# Patient Record
Sex: Female | Born: 1965 | Race: White | Hispanic: No | Marital: Married
Health system: Southern US, Community
[De-identification: ages and names within clinical notes are randomized; demographics above are authoritative.]

---

## 2012-10-10 ENCOUNTER — Inpatient Hospital Stay: Payer: Self-pay | Admitting: Specialist

## 2012-10-10 LAB — URINALYSIS, COMPLETE
Bilirubin,UR: NEGATIVE
Glucose,UR: NEGATIVE mg/dL (ref 0–75)
Ketone: NEGATIVE
Nitrite: NEGATIVE
Ph: 6 (ref 4.5–8.0)
Protein: >=500
RBC,UR: 3 /HPF (ref 0–5)
Specific Gravity: 1.033 (ref 1.003–1.030)
Squamous Epithelial: 4

## 2012-10-10 LAB — COMPREHENSIVE METABOLIC PANEL
Anion Gap: 8 (ref 7–16)
BUN: 8 mg/dL (ref 7–18)
Bilirubin,Total: 1.1 mg/dL — ABNORMAL HIGH (ref 0.2–1.0)
Calcium, Total: 8.7 mg/dL (ref 8.5–10.1)
Chloride: 102 mmol/L (ref 98–107)
EGFR (African American): >60
EGFR (Non-African Amer.): >60
Glucose: 194 mg/dL — ABNORMAL HIGH (ref 65–99)
Potassium: 2.7 mmol/L — ABNORMAL LOW (ref 3.5–5.1)
Total Protein: 8.3 g/dL — ABNORMAL HIGH (ref 6.4–8.2)

## 2012-10-10 LAB — DRUG SCREEN, URINE
Barbiturates, Ur Screen: NEGATIVE (ref ?–200)
Benzodiazepine, Ur Scrn: NEGATIVE (ref ?–200)
Cocaine Metabolite,Ur ~~LOC~~: NEGATIVE (ref ?–300)
Opiate, Ur Screen: NEGATIVE (ref ?–300)
Tricyclic, Ur Screen: NEGATIVE (ref ?–1000)

## 2012-10-10 LAB — CBC
HCT: 48.2 % — ABNORMAL HIGH (ref 35.0–47.0)
HGB: 16.4 g/dL — ABNORMAL HIGH (ref 12.0–16.0)
MCH: 28.6 pg (ref 26.0–34.0)
RBC: 5.73 10*6/uL — ABNORMAL HIGH (ref 3.80–5.20)

## 2012-10-10 LAB — ETHANOL: Ethanol: <3 mg/dL

## 2012-10-11 LAB — COMPREHENSIVE METABOLIC PANEL
Albumin: 3.6 g/dL (ref 3.4–5.0)
Albumin: 3.5 g/dL (ref 3.4–5.0)
Alkaline Phosphatase: 124 U/L (ref 50–136)
Anion Gap: 12 (ref 7–16)
BUN: 8 mg/dL (ref 7–18)
Bilirubin,Total: 1.4 mg/dL — ABNORMAL HIGH (ref 0.2–1.0)
Bilirubin,Total: 1.2 mg/dL — ABNORMAL HIGH (ref 0.2–1.0)
Calcium, Total: 8.3 mg/dL — ABNORMAL LOW (ref 8.5–10.1)
Calcium, Total: 8.8 mg/dL (ref 8.5–10.1)
Chloride: 101 mmol/L (ref 98–107)
Co2: 26 mmol/L (ref 21–32)
Co2: 25 mmol/L (ref 21–32)
Creatinine: 0.76 mg/dL (ref 0.60–1.30)
EGFR (African American): >60
EGFR (Non-African Amer.): >60
EGFR (Non-African Amer.): >60
Glucose: 294 mg/dL — ABNORMAL HIGH (ref 65–99)
Osmolality: 286 (ref 275–301)
Osmolality: 283 (ref 275–301)
SGOT(AST): 50 U/L — ABNORMAL HIGH (ref 15–37)
SGOT(AST): 40 U/L — ABNORMAL HIGH (ref 15–37)
SGPT (ALT): 82 U/L — ABNORMAL HIGH (ref 12–78)
SGPT (ALT): 79 U/L — ABNORMAL HIGH (ref 12–78)
Sodium: 139 mmol/L (ref 136–145)
Sodium: 137 mmol/L (ref 136–145)
Total Protein: 7.8 g/dL (ref 6.4–8.2)

## 2012-10-11 LAB — SALICYLATE LEVEL: Salicylates, Serum: <1.7 mg/dL

## 2012-10-11 LAB — CBC WITH DIFFERENTIAL/PLATELET
Basophil %: 0.2 %
Eosinophil %: 0 %
MCH: 29.3 pg (ref 26.0–34.0)
MCHC: 35.3 g/dL (ref 32.0–36.0)
Monocyte #: 0.1 x10 3/mm — ABNORMAL LOW (ref 0.2–0.9)
Monocyte %: 1 %
Neutrophil #: 10.8 10*3/uL — ABNORMAL HIGH (ref 1.4–6.5)
Neutrophil %: 88.5 %
Platelet: 230 10*3/uL (ref 150–440)
RBC: 5.38 10*6/uL — ABNORMAL HIGH (ref 3.80–5.20)
WBC: 12.2 10*3/uL — ABNORMAL HIGH (ref 3.6–11.0)

## 2012-10-11 LAB — MAGNESIUM: Magnesium: 1.6 mg/dL — ABNORMAL LOW

## 2012-10-11 LAB — ACETAMINOPHEN LEVEL: Acetaminophen: <2 ug/mL

## 2012-10-11 LAB — PROTIME-INR
INR: 1.2
Prothrombin Time: 15 secs — ABNORMAL HIGH (ref 11.5–14.7)

## 2012-10-12 ENCOUNTER — Inpatient Hospital Stay: Payer: Self-pay | Admitting: Psychiatry

## 2012-10-12 LAB — COMPREHENSIVE METABOLIC PANEL
Albumin: 3.2 g/dL — ABNORMAL LOW (ref 3.4–5.0)
Alkaline Phosphatase: 108 U/L (ref 50–136)
Anion Gap: 6 — ABNORMAL LOW (ref 7–16)
Calcium, Total: 8.4 mg/dL — ABNORMAL LOW (ref 8.5–10.1)
Chloride: 109 mmol/L — ABNORMAL HIGH (ref 98–107)
Co2: 26 mmol/L (ref 21–32)
Creatinine: 0.79 mg/dL (ref 0.60–1.30)
EGFR (African American): >60
Glucose: 175 mg/dL — ABNORMAL HIGH (ref 65–99)
Osmolality: 284 (ref 275–301)
Potassium: 3.4 mmol/L — ABNORMAL LOW (ref 3.5–5.1)
SGOT(AST): 21 U/L (ref 15–37)

## 2012-10-13 LAB — URINALYSIS, COMPLETE
Bilirubin,UR: NEGATIVE
Leukocyte Esterase: NEGATIVE
Nitrite: NEGATIVE
Protein: NEGATIVE
RBC,UR: 2 /HPF (ref 0–5)
Squamous Epithelial: 3
WBC UR: 1 /HPF (ref 0–5)

## 2012-10-13 LAB — BEHAVIORAL MEDICINE 1 PANEL
Albumin: 3.1 g/dL — ABNORMAL LOW (ref 3.4–5.0)
Alkaline Phosphatase: 111 U/L (ref 50–136)
Basophil %: 0.9 %
Chloride: 106 mmol/L (ref 98–107)
Co2: 30 mmol/L (ref 21–32)
EGFR (Non-African Amer.): >60
Eosinophil #: 0.1 10*3/uL (ref 0.0–0.7)
Eosinophil %: 1.8 %
HCT: 42.2 % (ref 35.0–47.0)
HGB: 14.5 g/dL (ref 12.0–16.0)
Lymphocyte %: 35 %
MCHC: 34.4 g/dL (ref 32.0–36.0)
MCV: 85 fL (ref 80–100)
Neutrophil #: 4.2 10*3/uL (ref 1.4–6.5)
Osmolality: 280 (ref 275–301)
Platelet: 192 10*3/uL (ref 150–440)
Potassium: 3 mmol/L — ABNORMAL LOW (ref 3.5–5.1)
RBC: 4.94 10*6/uL (ref 3.80–5.20)
RDW: 14.3 % (ref 11.5–14.5)
SGOT(AST): 49 U/L — ABNORMAL HIGH (ref 15–37)
SGPT (ALT): 62 U/L (ref 12–78)
Sodium: 140 mmol/L (ref 136–145)
Thyroid Stimulating Horm: 3.93 u[IU]/mL
Total Protein: 6.3 g/dL — ABNORMAL LOW (ref 6.4–8.2)
WBC: 7.5 10*3/uL (ref 3.6–11.0)

## 2012-10-16 LAB — BASIC METABOLIC PANEL
BUN: 16 mg/dL (ref 7–18)
Chloride: 102 mmol/L (ref 98–107)
Creatinine: 0.88 mg/dL (ref 0.60–1.30)
EGFR (Non-African Amer.): >60
Osmolality: 279 (ref 275–301)

## 2012-10-21 ENCOUNTER — Emergency Department: Payer: Self-pay | Admitting: Emergency Medicine

## 2013-03-23 ENCOUNTER — Emergency Department: Payer: Self-pay | Admitting: Emergency Medicine

## 2013-03-23 LAB — CBC WITH DIFFERENTIAL/PLATELET
Eosinophil %: 1.1 %
Lymphocyte %: 29.7 %
MCHC: 34.2 g/dL (ref 32.0–36.0)
MCV: 86 fL (ref 80–100)
Monocyte #: 0.4 x10 3/mm (ref 0.2–0.9)
Monocyte %: 6 %
Neutrophil #: 4.6 10*3/uL (ref 1.4–6.5)
Neutrophil %: 62.4 %
Platelet: 249 10*3/uL (ref 150–440)
RBC: 5.64 10*6/uL — ABNORMAL HIGH (ref 3.80–5.20)
RDW: 13.6 % (ref 11.5–14.5)

## 2013-03-23 LAB — COMPREHENSIVE METABOLIC PANEL
BUN: 9 mg/dL (ref 7–18)
Bilirubin,Total: 1 mg/dL (ref 0.2–1.0)
Chloride: 103 mmol/L (ref 98–107)
Creatinine: 0.72 mg/dL (ref 0.60–1.30)
EGFR (African American): >60
Glucose: 105 mg/dL — ABNORMAL HIGH (ref 65–99)
Osmolality: 275 (ref 275–301)
SGOT(AST): 61 U/L — ABNORMAL HIGH (ref 15–37)
SGPT (ALT): 79 U/L — ABNORMAL HIGH (ref 12–78)
Sodium: 138 mmol/L (ref 136–145)
Total Protein: 7.9 g/dL (ref 6.4–8.2)

## 2013-03-23 LAB — URINALYSIS, COMPLETE
Glucose,UR: NEGATIVE mg/dL (ref 0–75)
Ketone: NEGATIVE
Nitrite: NEGATIVE
RBC,UR: 1 /HPF (ref 0–5)

## 2013-03-23 LAB — PREGNANCY, URINE: Pregnancy Test, Urine: NEGATIVE m[IU]/mL

## 2013-04-24 ENCOUNTER — Emergency Department: Payer: Self-pay | Admitting: Emergency Medicine

## 2013-04-24 LAB — URINALYSIS, COMPLETE
Bacteria: NONE SEEN
Bilirubin,UR: NEGATIVE
Glucose,UR: NEGATIVE mg/dL (ref 0–75)
Nitrite: NEGATIVE
Ph: 7 (ref 4.5–8.0)
Protein: NEGATIVE
RBC,UR: 5 /HPF (ref 0–5)
Specific Gravity: 1.011 (ref 1.003–1.030)
WBC UR: 1 /HPF (ref 0–5)

## 2013-04-24 LAB — CBC
HCT: 44 % (ref 35.0–47.0)
MCH: 28.6 pg (ref 26.0–34.0)
MCHC: 33.4 g/dL (ref 32.0–36.0)
Platelet: 172 10*3/uL (ref 150–440)
WBC: 5.9 10*3/uL (ref 3.6–11.0)

## 2013-04-24 LAB — COMPREHENSIVE METABOLIC PANEL
Albumin: 3.4 g/dL (ref 3.4–5.0)
Alkaline Phosphatase: 125 U/L — ABNORMAL HIGH
Bilirubin,Total: 0.8 mg/dL (ref 0.2–1.0)
EGFR (Non-African Amer.): >60
Osmolality: 281 (ref 275–301)
Potassium: 3.5 mmol/L (ref 3.5–5.1)
SGPT (ALT): 42 U/L (ref 12–78)

## 2013-10-28 ENCOUNTER — Emergency Department: Payer: Self-pay | Admitting: Emergency Medicine

## 2013-10-28 LAB — COMPREHENSIVE METABOLIC PANEL
ALK PHOS: 102 U/L
AST: 22 U/L (ref 15–37)
Albumin: 3.8 g/dL (ref 3.4–5.0)
Anion Gap: 6 — ABNORMAL LOW (ref 7–16)
BUN: 11 mg/dL (ref 7–18)
Bilirubin,Total: 0.8 mg/dL (ref 0.2–1.0)
CALCIUM: 8.7 mg/dL (ref 8.5–10.1)
CHLORIDE: 103 mmol/L (ref 98–107)
Co2: 29 mmol/L (ref 21–32)
Creatinine: 0.9 mg/dL (ref 0.60–1.30)
EGFR (African American): >60
EGFR (Non-African Amer.): >60
Glucose: 97 mg/dL (ref 65–99)
OSMOLALITY: 275 (ref 275–301)
POTASSIUM: 3.2 mmol/L — AB (ref 3.5–5.1)
SGPT (ALT): 31 U/L (ref 12–78)
Sodium: 138 mmol/L (ref 136–145)
Total Protein: 7.7 g/dL (ref 6.4–8.2)

## 2013-10-28 LAB — HCG, QUANTITATIVE, PREGNANCY

## 2013-10-28 LAB — URINALYSIS, COMPLETE
BILIRUBIN, UR: NEGATIVE
Bacteria: NONE SEEN
GLUCOSE, UR: NEGATIVE mg/dL (ref 0–75)
Ketone: NEGATIVE
LEUKOCYTE ESTERASE: NEGATIVE
Nitrite: NEGATIVE
Ph: 7 (ref 4.5–8.0)
Protein: NEGATIVE
SPECIFIC GRAVITY: 1.002 (ref 1.003–1.030)
Squamous Epithelial: 1
WBC UR: NONE SEEN /HPF (ref 0–5)

## 2013-10-28 LAB — CBC
HCT: 42.7 % (ref 35.0–47.0)
HGB: 14.1 g/dL (ref 12.0–16.0)
MCH: 26.9 pg (ref 26.0–34.0)
MCHC: 32.9 g/dL (ref 32.0–36.0)
MCV: 82 fL (ref 80–100)
Platelet: 217 10*3/uL (ref 150–440)
RBC: 5.23 10*6/uL — ABNORMAL HIGH (ref 3.80–5.20)
RDW: 15.3 % — ABNORMAL HIGH (ref 11.5–14.5)
WBC: 8.9 10*3/uL (ref 3.6–11.0)
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
## 2014-08-24 NOTE — H&P (Signed)
PATIENT NAME:  Brenda Mcbride, Brenda Mcbride MR#:  [id number] DATE OF BIRTH:  October 27, 1965  DATE OF ADMISSION:  10/11/2012  REFERRING PHYSICIAN: Dr. Manson Passey.   PRIMARY CARE PHYSICIAN: Nonlocal.   CHIEF COMPLAINT: Intentional Tylenol drug overdose.   HISTORY OF PRESENT ILLNESS: The patient is a 49 year old Caucasian female with past medical history of hypertension not on any medications, chronic depression, obesity. She is brought into the ER after she intentionally overdosed herself with 40 of 500 mg Tylenol at around 9 a.m. today. The patient is reporting that she had an argument with her husband. Following that, she wanted to kill herself and took Tylenol. Denies any other medication overdosing. She was brought into the ER, and she was started on IV Mucomyst for overdose. No family members at bedside. The patient was made IVC, and hospitalist team is called to admit the patient. The patient has developed a rash on her upper extremities and upper torso while receiving Mucomyst infusion. The Mucomyst infusion was discontinued by the ER physician, and she was given Benadryl and Solu-Medrol. Eventually during my examination, the patient was complaining of tongue feeling funny. Denies any shortness of breath, chest tightness or throat closing sensation. The patient has received EpiPen immediately. Call is placed to Poison Control. The patient also reported that her rash on the chest and upper extremities is fading away after given Benadryl and Solu-Medrol. Denies any other complaints. Denies nausea, vomiting, dizziness or loss of consciousness.   PAST MEDICAL HISTORY: Hypertension not on any medications, chronic depression not seeing any psychiatrist or behavioral therapist, obesity.   PAST SURGICAL HISTORY: C-section, tonsillectomy, fistulectomy.   ALLERGIES: The patient has developed a rash to MUCOMYST.   PSYCHOSOCIAL HISTORY: Lives at home with her husband and son. Denies any history of smoking, alcohol or illicit drug  usage.   FAMILY HISTORY: Both parents have history of hypertension.   HOME MEDICATIONS: None.    REVIEW OF SYSTEMS:  CONSTITUTIONAL: Denies any fever, fatigue, weakness, weight loss or weight gain.  EYES: Denies any blurry vision, glaucoma, cataracts.  ENT: Denies any tinnitus, epistaxis, discharge, postnasal drip or sinus pain.  RESPIRATION: Denies any cough, hemoptysis, dyspnea, painful respiration.  CARDIOVASCULAR: No chest pain, palpitations or syncope.  GASTROINTESTINAL: Denies any nausea, vomiting, diarrhea, abdominal pain.  GENITOURINARY: No dysuria or hematuria.  GYNECOLOGIC AND BREASTS: Denies any breast mass or vaginal discharge.  ENDOCRINE: Denies any polyuria, nocturia, thyroid problems.  HEMATOLOGIC AND LYMPHATIC: Denies any anemia, easy bruising or bleeding.  INTEGUMENTARY: No acne. Developed rash after given Mucomyst infusion which is fading away after given Solu-Medrol and Benadryl.  MUSCULOSKELETAL: Denies any pain in the neck, back, shoulder. Denies any gout.  NEUROLOGIC: Denies any vertigo, ataxia, dementia, stroke.  PSYCHIATRIC: Chronic history of depression is present. Denies any insomnia, bipolar disorder or schizophrenia.   PHYSICAL EXAMINATION:  VITAL SIGNS: Temperature 98.3, pulse 77, respirations 18, blood pressure 116/75, pulse ox 97%.  GENERAL APPEARANCE: Not in any acute distress, moderately built and obese.  HEENT: Normocephalic, atraumatic. Pupils are equally reacting light and accommodation. No scleral icterus. No conjunctival injection. No sinus tenderness. No postnasal drip. Oral cavity: Uvula is midline. Status post tonsillectomy. No edema of the lips or tongue.  NECK: Supple. No JVD. No thyromegaly. No lymphadenopathy.  LUNGS: Clear to auscultation. No accessory muscle usage. No anterior chest wall tenderness on palpation.  CARDIAC: S1, S2 normal. Regular rate and rhythm. No murmurs.  GASTROINTESTINAL: Soft. Bowel sounds are positive in all 4  quadrants. Obese. No masses  felt. No hepatosplenomegaly.  NEUROLOGIC: Awake, alert, oriented x3. Motor and sensory are grossly intact. Cranial nerves II through XII are intact. Reflexes are 2+.  EXTREMITIES: No edema. No cyanosis. No clubbing.  PSYCHIATRIC: Flat mood and affect.  SKIN: No urticaria. Fading rash is noticed on the upper extremities and upper torso. Rash has faded away. Normal turgor. No other lesions are noticed.  MUSCULOSKELETAL: No joint effusion, tenderness or erythema.   LABS AND IMAGING STUDIES: Urinalysis: Yellow in color, clear in appearance, nitrites negative, leukocyte esterase negative. Serum Tylenol level 24. Salicylate level less than 1.7 at 20/30 minutes. Urine pregnancy test is negative.hemoglobin 16.4, hematocrit 48.2, platelets 247. TSH is 2.52. Urine drug screen is negative. LFTs: Total protein is elevated at 8.3, albumin 4.1, bili total is 1.1, alkaline phosphatase 148, AST 62, ALT 87. Glucose 194, BUN 8, creatinine 0.79, sodium 136, potassium is 2.7, chloride 102, CO2 26. GFR greater than 60. Anion gap 8. Serum osmolality 276. Calcium 8.7. Serum alcohol level is less than 3. Alcohol percent less than 0.003.   ASSESSMENT AND PLAN: A 49 year old Caucasian female presenting to the ER after intentional Tylenol drug overdose and developed allergic reaction rash to ACETYLCYSTEINE after starting infusion. Will be admitted with the following assessment and plan:  1. Intentional Tylenol overdose: The patient was given Mucomyst intravenous with close monitoring for allergic reaction. Will admit her to critical care unit for close observation. Oxygen as needed for shortness of breath and hypoxia. EpiPen as needed will be given for anaphylactic reaction. Will repeat complete metabolic panel and Tylenol levels every 4 hours. Will check a CBC, PT, INR in a.m.  2. Rash with MUCOMYST but no anaphylactic reaction. The patient has received EpiPen , Solu-Medrol intravenous, Benadryl and  ranitidine in the ER. Poison Control was contacted and awaiting call back. Will restart Mucomyst after complete fading of the rash  approximately in 1 hour.  3. Hypertension: Blood pressure is elevated. Will give her a small dose of amlodipine.  4. Acute on chronic depression: Psych consult is placed. The patient is made IVC. Will continue one-on-one observation.  5. Will provide her gastrointestinal prophylaxis with ranitidine and deep vein thrombosis prophylaxis with Lovenox subcutaneous.   She is FULL CODE.    The diagnosis and plan of care was discussed in detail with the patient. She is aware of the plan.   CRITICAL CARE TIME SPENT: 60 minutes.   ____________________________ Ramonita LabAruna Johnryan Sao, MD ag:gb D: 10/11/2012 00:44:08 ET T: 10/11/2012 01:17:48 ET JOB#: [id number]  cc: Ramonita LabAruna Lavonna Lampron, MD, <Dictator> Ramonita LabARUNA Stefon Ramthun MD ELECTRONICALLY SIGNED 10/12/2012 5:12

## 2014-08-24 NOTE — Discharge Summary (Signed)
PATIENT NAME:  Brenda FendtBENNETT, Malvika MR#:  [id number] DATE OF BIRTH:  04-17-1966  DATE OF ADMISSION:  10/10/2012 DATE OF DISCHARGE:  10/12/2012  The patient was discharged to Behavioral Medicine on 10/12/2012.   For a detailed note, please take a look at the history and physical done on admission by Dr. Amado CoeGouru.   DIAGNOSES AT DISCHARGE: 1.  Tylenol toxicity.  2.  Depression with suicide attempt.  3.  Hypertension.  4.  Hypokalemia.   DIET:  The patient was discharged on a low-sodium diet.   ACTIVITY:  As tolerated.   FOLLOW-UP:  With her primary care physician.  She needs one assigned.   PERTINENT STUDIES DONE DURING THE HOSPITAL COURSE:  Are as follows:  A Tylenol level done on June 9th at 8:30 in the evening at 24.  Tylenol level done on June 10th at 6:00 a.m. was less than 2.   BRIEF HOSPITAL COURSE:  This is a 49 year old female who presented to the hospital on 10/11/2012 secondary to a suicide attempt with increasing intake of Tylenol.  1.  Drug overdose.  This was secondary to Tylenol.  The patient apparently took greater than 50 pills of 500 mg tabs of Tylenol.  When she presented to the hospital her LFTs were mildly elevated.  She had no coagulopathy.  She had no abdominal pain, nausea, vomiting.  The patient was started on a Mucomyst drip.  Apparently she developed some kind of an ALLERGIC REACTION TO MUCOMYST which was intermittently stopped for a little bit and then restarted after she was pretreated with some steroids and some Benadryl.  The patient received Mucomyst for about 24 hours and her LFTs normalized.  She had no nausea, vomiting and no other systemic complaints.  The patient was involuntarily committed given her suicide attempt and depression and a psychiatric consult was also obtained.  2.  Hypertension.  The patient has a history of hypertension and was not taking any meds.  She was noted to have significantly elevated blood pressures.  In the previous past she had been on  hydrochlorothiazide and atenolol.  I started her on some Coreg and Norvasc while in the hospital which seemed to control her blood pressure.  Also placed her on some IV labetalol, as needed hydralazine, which seemed to improve her blood pressure significantly.  The patient at this time is being discharged to Behavioral Medicine on the Coreg and Norvasc as stated.  3.  Hypokalemia.  This was secondary to poor by mouth intake.  This has since then been supplemented and now resolved.  4.  Depression with suicide attempt.  The patient was placed on involuntarily commitment in the Emergency Room.  A psychiatric consult was obtained.  Given her history of depression and apparent suicide attempt, she was transferred to Behavioral Medicine on 10/12/2012.   DISCHARGE MEDICATIONS:  Norvasc 10 mg daily and Coreg 6.25 mg twice daily.   Time spent on discharge is 40 minutes.    ____________________________ Rolly PancakeVivek J. Cherlynn KaiserSainani, MD vjs:ea D: 10/14/2012 15:22:49 ET T: 10/15/2012 01:54:43 ET JOB#: [id number]  cc: Rolly PancakeVivek J. Cherlynn KaiserSainani, MD, <Dictator> Houston SirenVIVEK J Siona Coulston MD ELECTRONICALLY SIGNED 10/21/2012 20:38

## 2014-09-11 NOTE — H&P (Signed)
Psychiatric Admission Assessment Adult Patient Identification: 49 yo caucasian female Date of Evaluation:  10/13/2012 Chief Complaint: suicide attempt due to several stressors History of Present Illness (8 essential elements):  Patient is a [age] yo white married female who overdosed on 40 pills of 500mg  of tylenol. states she is having relationship problems with her husband, some financial issues. States she became overwhelmed with her life. She has never been in therapy or have psychiaTRIC HOSPITALIZATIONS. Reports her husband had addiction problems , was in rehab and is better now. however he has taken up a low paying job and has been pressurizing her to work. This has made her more anxious and depressed.has 4 children, they are doing well.use of drugs or alcohol. Associated Signs/SymptomsSymptoms:  endorsing depressed mood, anhedonia, hopelessness, poor sleep (Hypo) Manic Symptoms:  deniesAnxiety Symptoms:  high anxiety Psychotic Symptoms:  denies PTSD Symptoms:  Denies  Psychiatric Specialty Exam: Physical Exam:  Completed in ED, reviewed, stable  ROS:   CONSTITUTIONAL: No weight loss, fever, chills, weakness or fatigue. HEENT: Eyes: No diplopia or blurred vision. ENT: No earache, sore throat or runny nose.  CARDIOVASCULAR: No pressure, squeezing, strangling, tightness, heaviness or aching about the chest, neck, axilla or epigastrium.  RESPIRATORY: No cough, shortness of breath, dyspnea or orthopnea.  GASTROINTESTINAL: No nausea, vomiting or diarrhea.  GENITOURINARY: No dysuria, frequency or urgency.  MUSCULOSKELETAL: No muscle or joint pain, stiffness.  SKIN: No change in skin, hair or nails.  NEURO: No paresthesias, fasciculations, seizures or weakness.  PSYCHIATRIC: depression, anxiety  ENDOCRINE: No heat or cold intolerance, polyuria or polydipsia.  HEMATOLOGICAL: No easy bruising or bleeding.   Vital Signs:  Blood pressure 160/96, pulse 115, temperature 98.3 degrees F oral, respiratory  rate 18  General Appearance:  Neat  Eye Contact:  Fair  Speech:  Normal rate  Volume:  Normal  Mood:  Agitated  Affect:  Flat  Thought Process:  normal  Orientation:  Alert and oriented x 3  Thought Content:  ruminative  Suicidal Thoughts: Denies  Homicidal Thoughts:  Denies  Memory:  fair  Judgment: fair  Insight:  fair  Psychomotor Activity:  normal  Concentration:  fair  Recall:  fair  Akathisia:  None  Handed:  Right  AIMS (if indicated):    Assets:  Resilience, physical health, social support  Sleep:  0 hours   Past Psychiatric History: Diagnosis:  MDD  Hospitalizations:  none  Outpatient Care:  None  Substance Abuse Care:  None  Self-Mutilation:  None  Suicidal Attempts:  Denies  Violent Behaviors:  Denies   Past Medical History:  None (denies)  Past Medical (concussions, head injury, cardiac issues with apnea episode):  None  Allergies:  None  PTA Medications:  None  Previous Psychotropic Medications:  None  Medication/Dose:  None           Substance Abuse History in the last 12 months:  denies Consequences of Substance Abuse:  None  Social History:  Lives with husband and children  Additional Social History: Marital Status:  married Education:  Automotive engineerCollege History of Abuse (Emotional/Physical/Sexual):  Denies Hotel managerMilitary History:  None  Family History:  Denies  No results found for this or any previous visit (from the past 72 hours).Evaluations Assessment:I:  MDDII:  DeferredIII:  HTN AXIS IV: relationship issues AXIS V:  40  Treatment Plan/Recommendations:  Review of chart, vital signs, medications, and notes. 1. Admit for crisis management and stabilization; estimated length of stay 5-7 days.Individual and group therapy encouraged.Medication  management for depression. 4. Coping skills for depression.Continue crisis stabilization and management.Address health issues:  Monitoring BP, elevated, responding to Clonidine, will continue to monitor  closely.Treatment plan in progress to prevent relapse of depression and anxiety.Psychosocial education regarding relapse prevention and self-care.Health care follow-up for any health concerns that may arise.Call for consult with hospitalist for addtional specialty patient services as needed.  Current Medications:  Zoloft 25mg  po qd  Observation Level/Precautions:  Every 15 minute checks  Laboratory:  Completed and reviewed, stable  Psychotherapy:  Individual and group therapy  Medications:  Management  Consultations:  None  Discharge Concerns:  Living arrangements  Estimated LOS:  5-7 days  Other:  certify that inpatient services furnished can reasonably be expected to improve the patient's condition.  Hima Daxtin Leiker,MD   Electronic Signatures: Patrick Northavi, Kesi Perrow (MD)  (Signed on 12-Jun-14 11:43)  Authored  Last Updated: 12-Jun-14 11:43 by Patrick Northavi, Asbury Hair (MD)

## 2015-10-01 IMAGING — US US PELV - US TRANSVAGINAL
1 series · 13 of 25 positions shown · non-contrast
Comparison: None

CLINICAL DATA: Vaginal bleeding.

EXAM:
TRANSABDOMINAL AND TRANSVAGINAL ULTRASOUND OF PELVIS
TECHNIQUE: Both transabdominal and transvaginal ultrasound examinations of the
pelvis were performed. Transabdominal technique was performed for
global imaging of the pelvis including uterus, ovaries, adnexal
regions, and pelvic cul-de-sac. It was necessary to proceed with
endovaginal exam following the transabdominal exam to visualize the
endometrium and adnexal structures..

[Series 1: us pelv - us transvaginal · 0.28mm/px · 68 acquisitions, 13 frames shown]
[im 1/68]
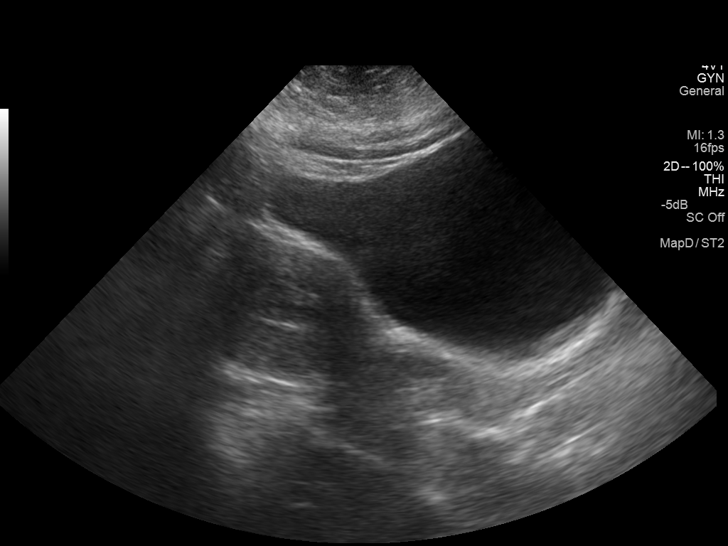
[im 6/68]
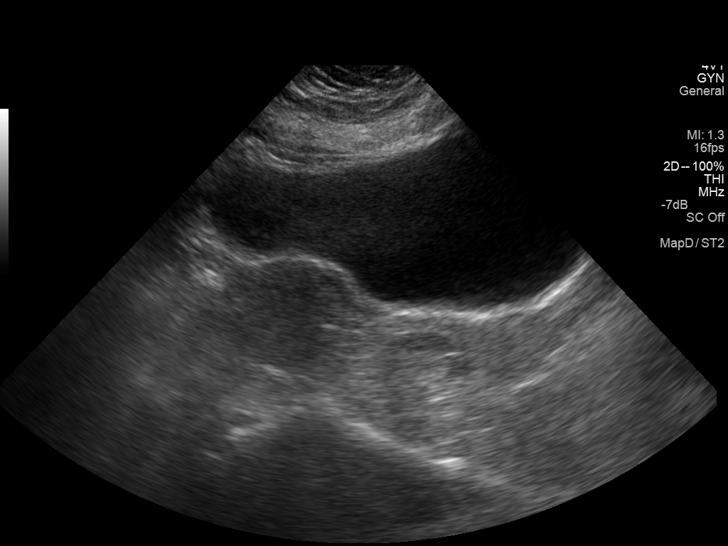
[im 12/68]
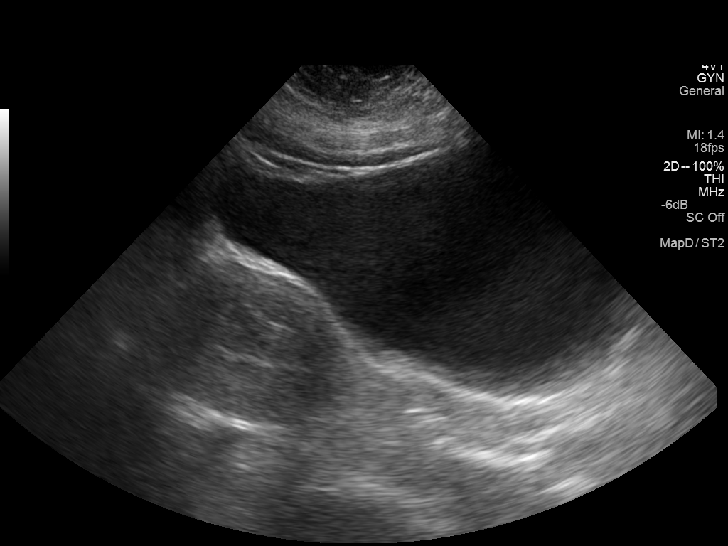
[im 17/68]
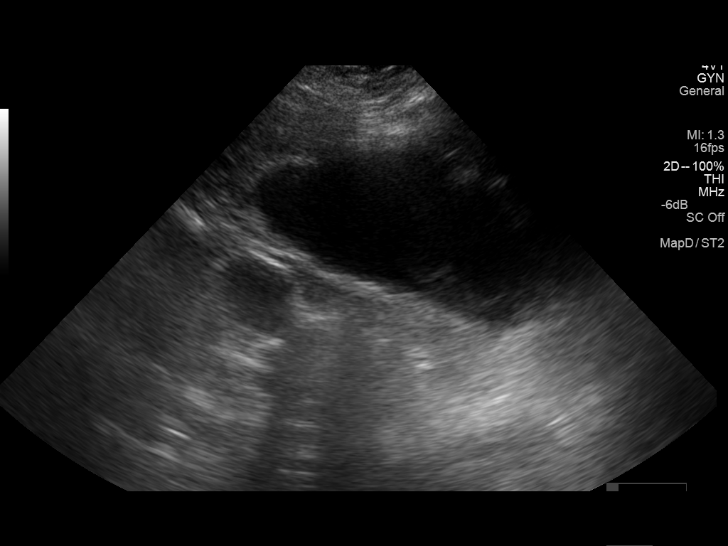
[im 23/68]
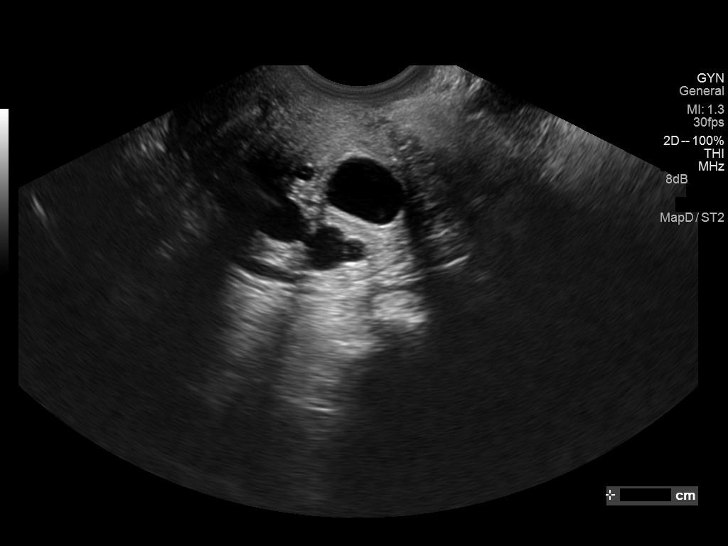
[im 28/68]
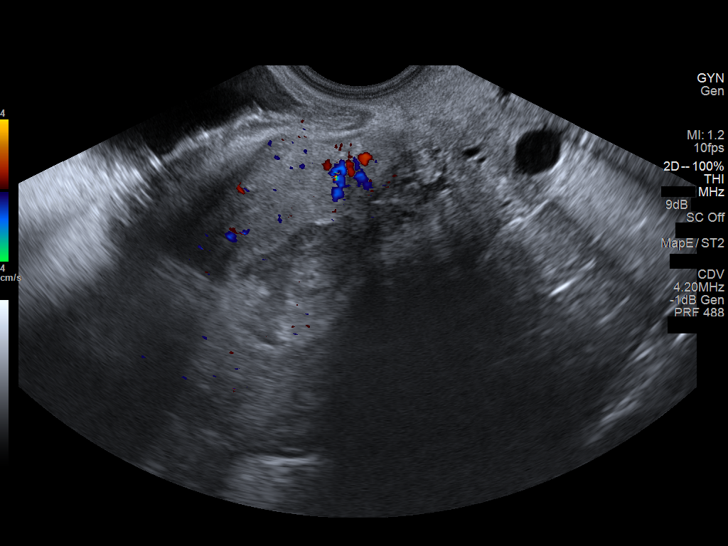
[im 34/68]
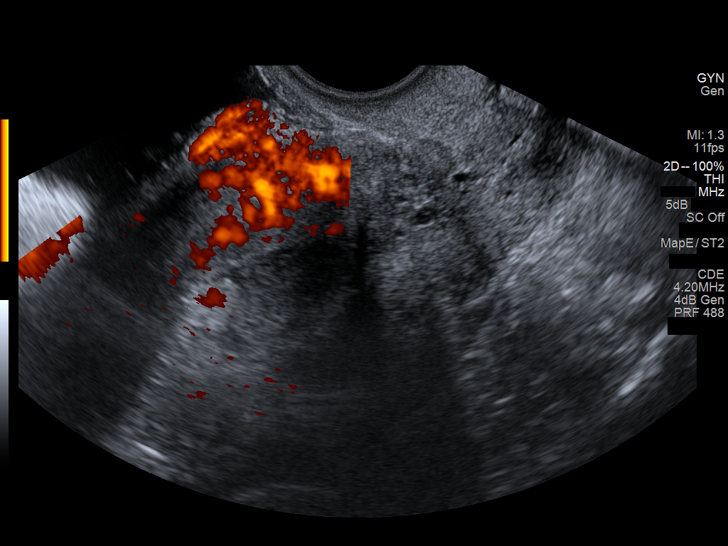
[im 40/68]
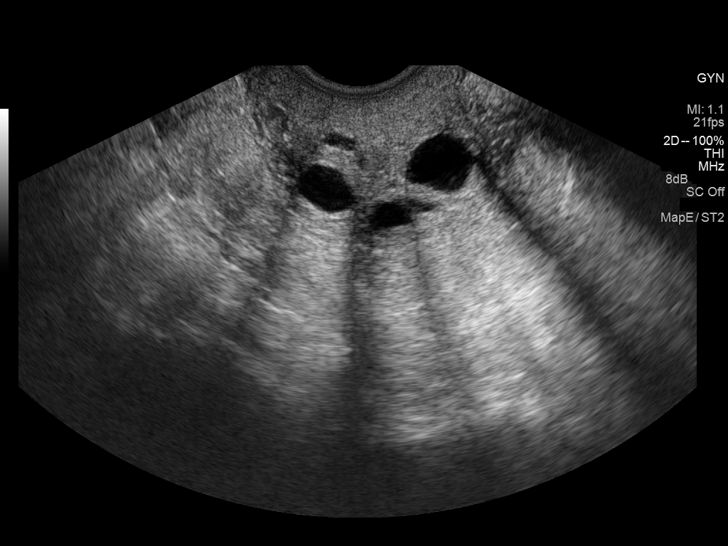
[im 45/68]
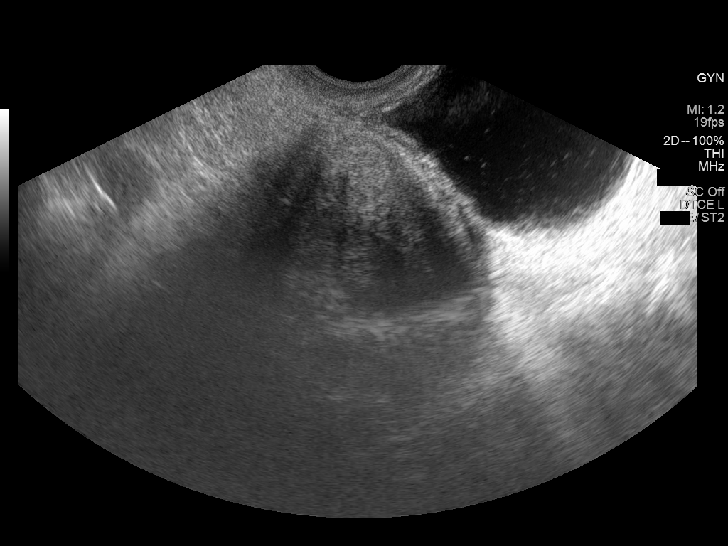
[im 51/68]
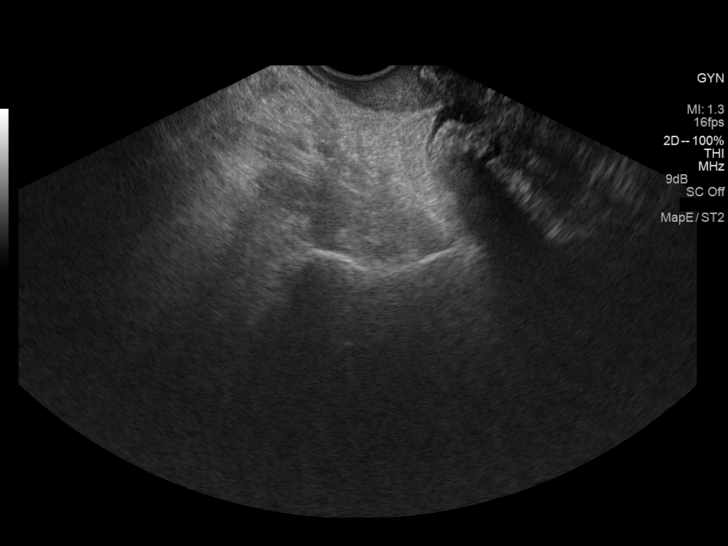
[im 56/68]
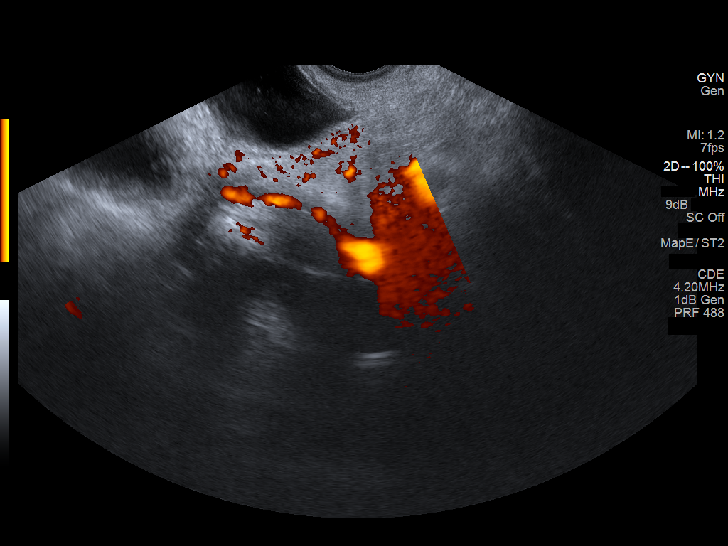
[im 62/68]
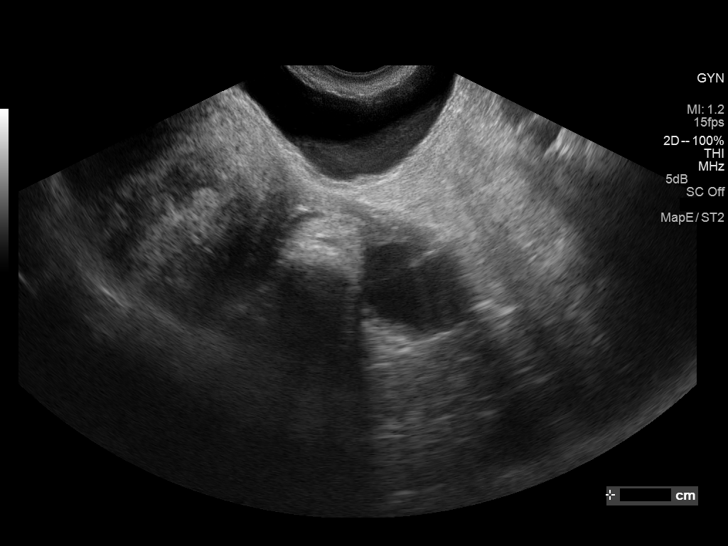
[im 68/68]
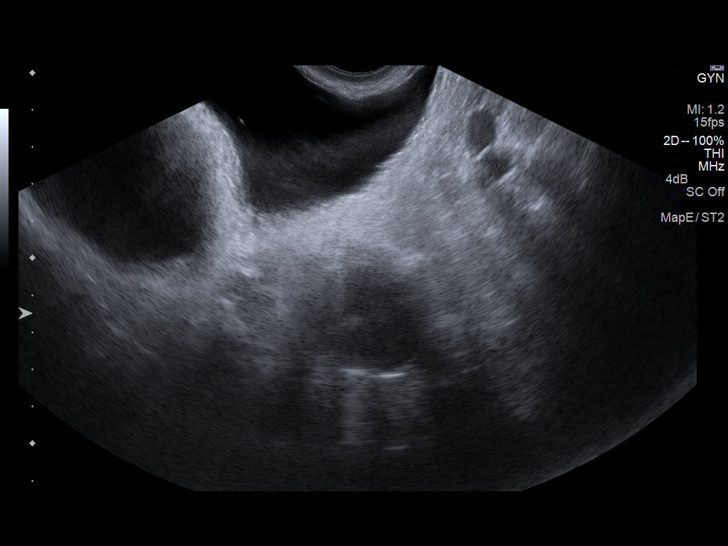

[13 of 25 positions shown; findings below may reference images not displayed]

FINDINGS: Uterus

Measurements: 12.8 x 5.3 x 7.1 cm. No fibroids or other mass
visualized. Nabothian cysts.

Endometrium

Thickness: 20 mm. Heterogeneous in echogenicity. Suggestion of a 1
cm echogenic mass within the endometrium with internal vascular
blood flow.

Right ovary

Not visualized

Left ovary

Measurements: 6.8 x 4.0 x 5.4 cm. There is suggestion of a cyst
within the left ovary with peripheral mural hyperechoic nodularity
measuring up to 2 cm. Suggestion of vascular flow to the mural
nodule on color Doppler imaging. The adjacent cystic component
measures approximately 3.2 cm.

Other findings

No free fluid.
IMPRESSION: 1. Markedly thickened endometrium with suggestion of focal
hyperechoic endometrial mass. This may represent an endometrial
polyp. Other considerations include endometrial hyperplasia or
endometrial carcinoma. As a focal endometrial lesion is suspected
consider sonohysterogram for further evaluation, prior to
hysteroscopy or endometrial biopsy.
2. Complex solid and cystic mass within the left adnexa. The solid
nodularity is homogeneously hyperechoic however does demonstrate
color blood flow. Given the hyperechoic nature this could
potentially represent a dermoid however recommend further evaluation
with surgical consultation and pelvic MRI for complete
characterization as malignancy is a consideration.
These results were called by telephone at the time of interpretation
on 10/28/2013 at [DATE] to Dr. NEMESI TRAC , who verbally
acknowledged these results.
